# Patient Record
Sex: Female | Born: 1935 | Race: White | Hispanic: No | Marital: Married
Health system: Southern US, Community
[De-identification: ages and names within clinical notes are randomized; demographics above are authoritative.]

---

## 2011-08-04 ENCOUNTER — Emergency Department: Payer: Self-pay

## 2011-08-24 ENCOUNTER — Emergency Department: Payer: Self-pay | Admitting: *Deleted

## 2011-09-27 ENCOUNTER — Emergency Department: Payer: Self-pay | Admitting: Emergency Medicine

## 2011-09-27 ENCOUNTER — Inpatient Hospital Stay: Payer: Self-pay | Admitting: Internal Medicine

## 2011-09-27 LAB — URINALYSIS, COMPLETE
Bacteria: NONE SEEN
Bilirubin,UR: NEGATIVE
Glucose,UR: NEGATIVE mg/dL (ref 0–75)
Hyaline Cast: 1
Ketone: NEGATIVE
Nitrite: NEGATIVE
Ph: 6 (ref 4.5–8.0)
Protein: NEGATIVE
Specific Gravity: 1.003 (ref 1.003–1.030)
Squamous Epithelial: <1
Transitional Epi: <1
WBC UR: 1 /HPF (ref 0–5)

## 2011-09-27 LAB — BASIC METABOLIC PANEL
BUN: 19 mg/dL — ABNORMAL HIGH (ref 7–18)
Chloride: 103 mmol/L (ref 98–107)
EGFR (African American): 56 — ABNORMAL LOW
EGFR (Non-African Amer.): 49 — ABNORMAL LOW
Glucose: 167 mg/dL — ABNORMAL HIGH (ref 65–99)
Osmolality: 282 (ref 275–301)
Potassium: 5.2 mmol/L — ABNORMAL HIGH (ref 3.5–5.1)
Sodium: 138 mmol/L (ref 136–145)

## 2011-09-27 LAB — CBC
HCT: 35.4 % (ref 35.0–47.0)
HGB: 11.8 g/dL — ABNORMAL LOW (ref 12.0–16.0)
MCV: 83 fL (ref 80–100)
RBC: 4.25 10*6/uL (ref 3.80–5.20)
WBC: 9.1 10*3/uL (ref 3.6–11.0)

## 2011-09-27 LAB — COMPREHENSIVE METABOLIC PANEL
Albumin: 3.7 g/dL (ref 3.4–5.0)
Alkaline Phosphatase: 96 U/L (ref 50–136)
Anion Gap: 7 (ref 7–16)
BUN: 15 mg/dL (ref 7–18)
Bilirubin,Total: 0.3 mg/dL (ref 0.2–1.0)
Calcium, Total: 8.9 mg/dL (ref 8.5–10.1)
Co2: 30 mmol/L (ref 21–32)
Creatinine: 0.9 mg/dL (ref 0.60–1.30)
EGFR (Non-African Amer.): >60
Osmolality: 284 (ref 275–301)
Potassium: 3.6 mmol/L (ref 3.5–5.1)
SGOT(AST): 24 U/L (ref 15–37)
SGPT (ALT): 19 U/L
Sodium: 142 mmol/L (ref 136–145)
Total Protein: 7.6 g/dL (ref 6.4–8.2)

## 2011-09-27 LAB — TROPONIN I: Troponin-I: <0.02 ng/mL

## 2011-09-27 LAB — CBC WITH DIFFERENTIAL/PLATELET
Basophil #: 0.1 10*3/uL (ref 0.0–0.1)
Eosinophil #: 0.3 10*3/uL (ref 0.0–0.7)
Lymphocyte #: 2 10*3/uL (ref 1.0–3.6)
MCH: 27.9 pg (ref 26.0–34.0)
MCHC: 33.8 g/dL (ref 32.0–36.0)
MCV: 82 fL (ref 80–100)
Neutrophil %: 69.2 %
Platelet: 208 10*3/uL (ref 150–440)
RDW: 15.8 % — ABNORMAL HIGH (ref 11.5–14.5)

## 2011-09-28 LAB — BASIC METABOLIC PANEL
BUN: 18 mg/dL (ref 7–18)
Chloride: 106 mmol/L (ref 98–107)
Co2: 24 mmol/L (ref 21–32)
Creatinine: 1.05 mg/dL (ref 0.60–1.30)
EGFR (African American): 59 — ABNORMAL LOW
EGFR (Non-African Amer.): 51 — ABNORMAL LOW
Glucose: 72 mg/dL (ref 65–99)
Potassium: 3.7 mmol/L (ref 3.5–5.1)
Sodium: 142 mmol/L (ref 136–145)

## 2011-09-28 LAB — CBC WITH DIFFERENTIAL/PLATELET
Basophil #: 0.1 10*3/uL (ref 0.0–0.1)
Basophil %: 0.6 %
Eosinophil %: 1.2 %
Lymphocyte %: 24.5 %
MCH: 28.1 pg (ref 26.0–34.0)
MCHC: 33.7 g/dL (ref 32.0–36.0)
MCV: 84 fL (ref 80–100)
Monocyte %: 7.9 %
Neutrophil #: 6.8 10*3/uL — ABNORMAL HIGH (ref 1.4–6.5)
Platelet: 183 10*3/uL (ref 150–440)
RBC: 4.24 10*6/uL (ref 3.80–5.20)
RDW: 15.9 % — ABNORMAL HIGH (ref 11.5–14.5)

## 2011-09-28 LAB — LIPID PANEL
HDL Cholesterol: 50 mg/dL (ref 40–60)
VLDL Cholesterol, Calc: 21 mg/dL (ref 5–40)

## 2011-09-28 LAB — PROTIME-INR: INR: 1

## 2011-09-29 ENCOUNTER — Ambulatory Visit: Payer: Self-pay | Admitting: Internal Medicine

## 2011-10-02 LAB — PLATELET COUNT: Platelet: 177 10*3/uL (ref 150–440)

## 2011-10-30 ENCOUNTER — Ambulatory Visit: Payer: Self-pay | Admitting: Internal Medicine

## 2011-10-30 DEATH — deceased

## 2013-05-04 IMAGING — CT CT HEAD WITHOUT CONTRAST
2 series · 15 of 30 positions shown, 19 images · non-contrast
Comparison: none

REASON FOR EXAM: injury to head from fall
COMMENTS:

[Series 2: without · axial · non-contrast · 0.41mm/px · z∈[+414,+538]mm · 13 of 31 slices shown, 17 images]
[im 3/31  brain]
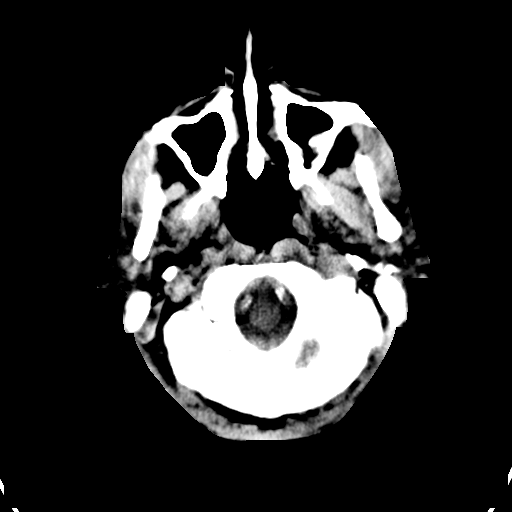
[im 3/31  bone]
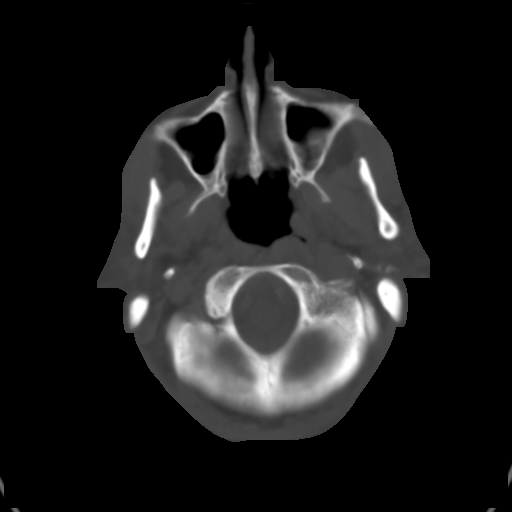
[im 5/31  brain]
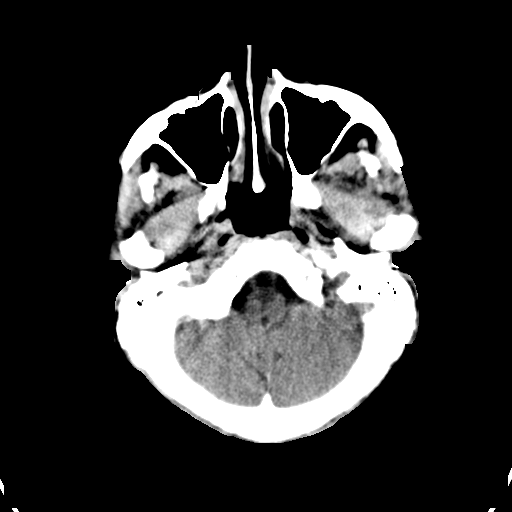
[im 7/31  brain]
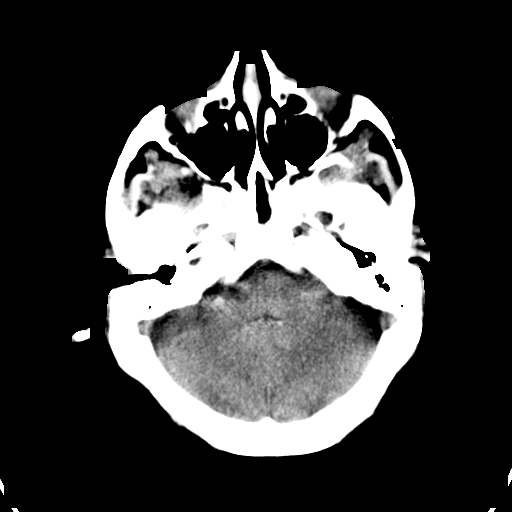
[im 9/31  brain]
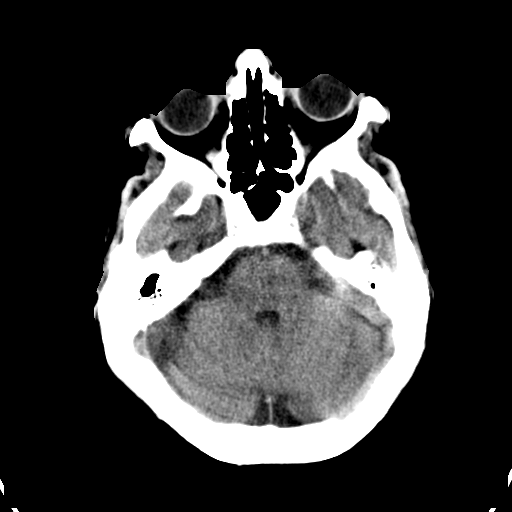
[im 11/31  brain]
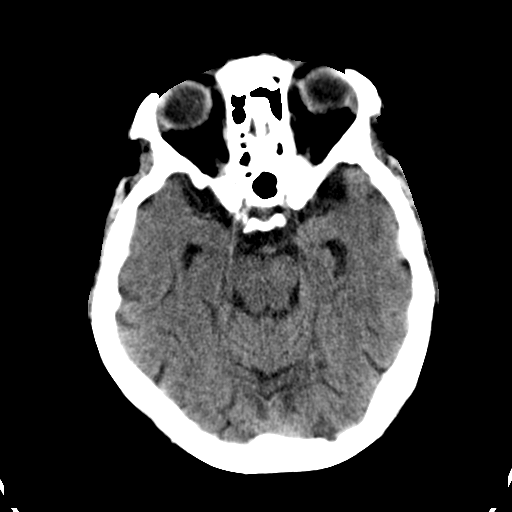
[im 11/31  bone]
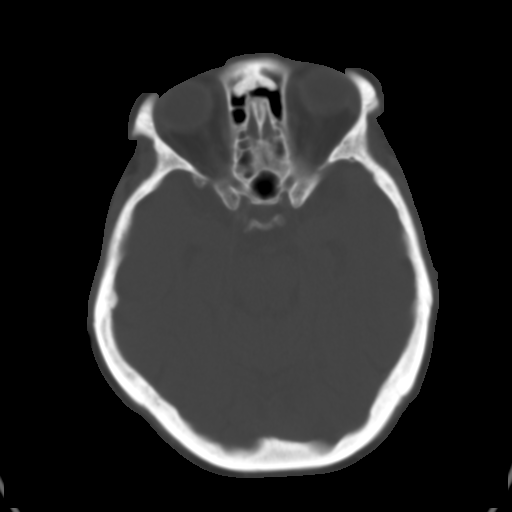
[im 13/31  brain]
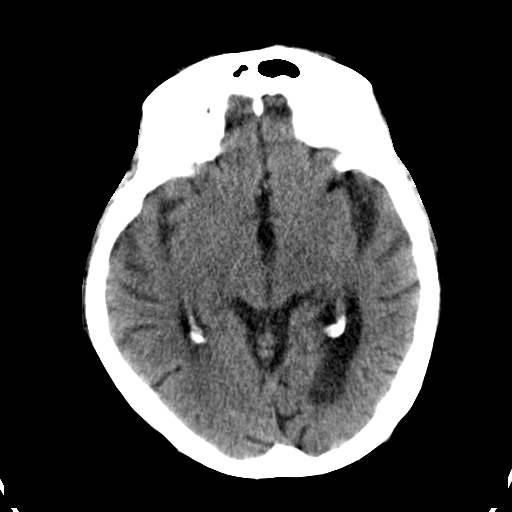
[im 16/31  brain]
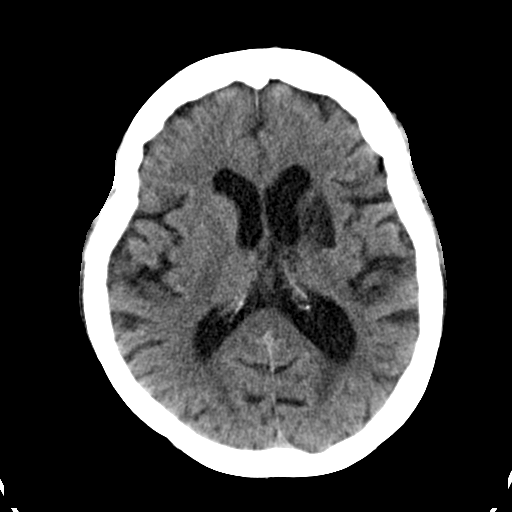
[im 18/31  brain]
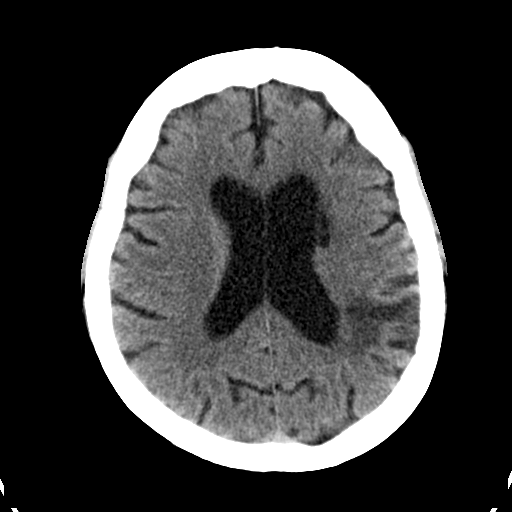
[im 20/31  brain]
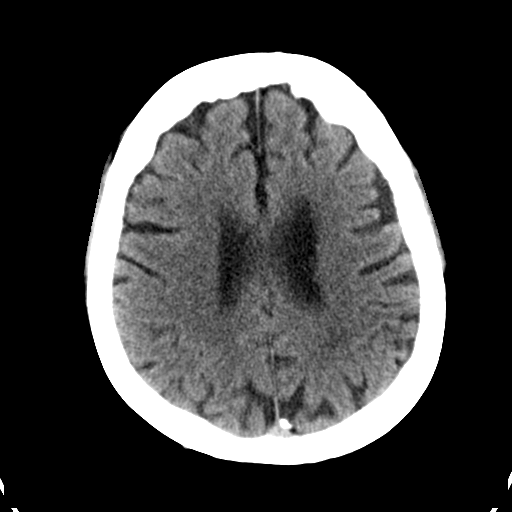
[im 20/31  bone]
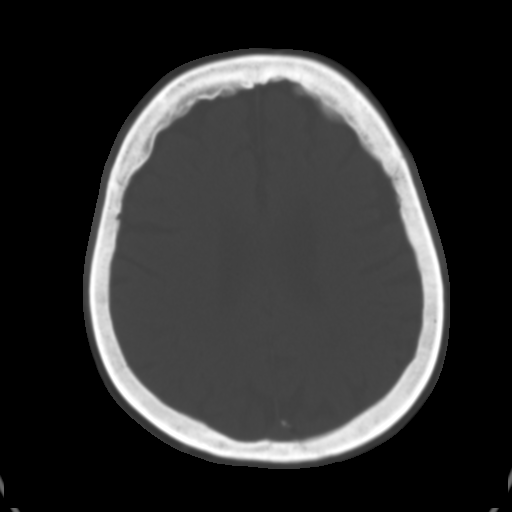
[im 22/31  brain]
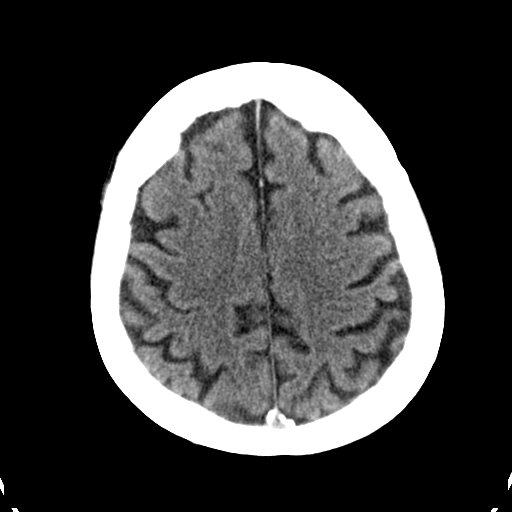
[im 24/31  brain]
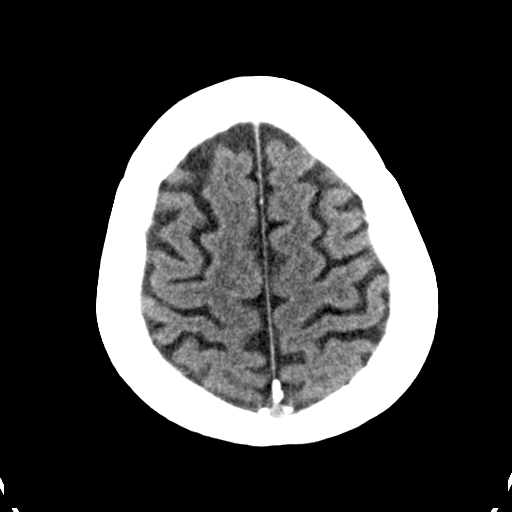
[im 26/31  brain]
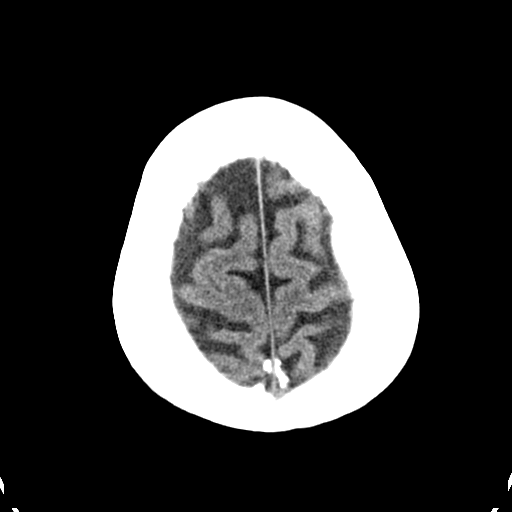
[im 28/31  brain]
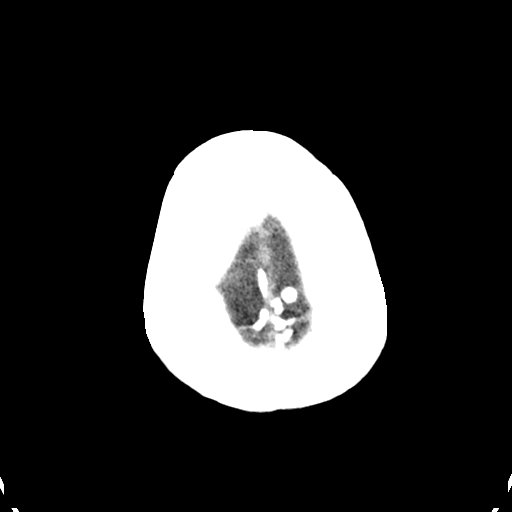
[im 28/31  bone]
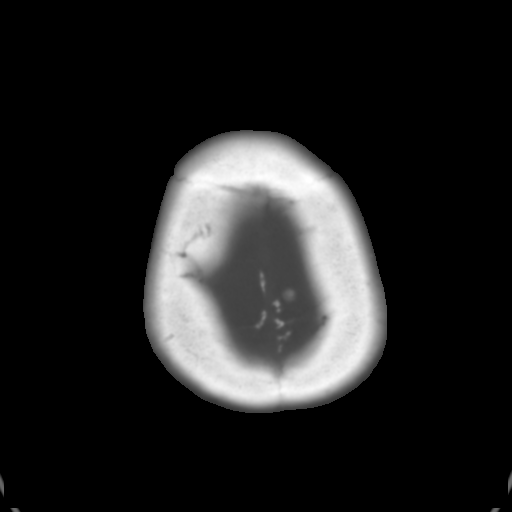

[Series 3: bone · axial · 0.41mm/px · z∈[+414,+434]mm · 2 of 31 slices shown]
[im 3/31  bone]
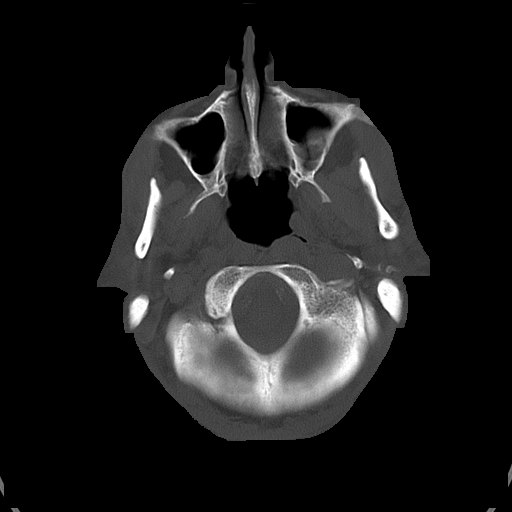
[im 7/31  bone]
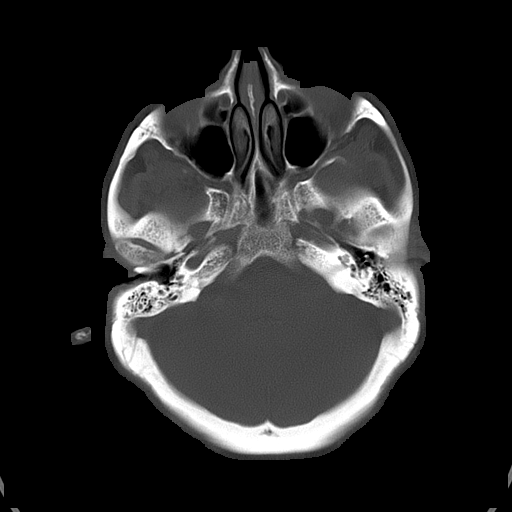

[15 of 30 positions shown; findings below may reference images not displayed]

PROCEDURE:     CT  - CT HEAD WITHOUT CONTRAST  - August 04, 2011  [DATE]

RESULT:     Axial noncontrast CT scanning was performed through the brain
with reconstructions at 5 mm intervals and slice thicknesses. There are no
previous studies for comparison.

There is mild diffuse cerebral and cerebellar atrophy with compensatory
ventriculomegaly. There is encephalomalacia in the left basal ganglia
consistent with previous lacunar infarction. There is decreased density in
the gray and white matter in the posterior parietal lobe on the left which
is nonspecific. There is no evidence of an acute intracranial hemorrhage.
The cerebellum and brainstem are normal in density. At bone window settings
there is no evidence of an acute skull fracture. The observed portions of
the paranasal sinuses and mastoid air cells are clear.
IMPRESSION: 1. There is hypodensity in the left parietal lobe which is of uncertain
etiology and could reflect an area of subacute or old ischemic change. I do
not see significant mass effect and there may be mild ex vacuo dilation of
the posterior horn of the left lateral ventricle.
2. There is an old lacunar infarction in the basal ganglia on the left.
3. I do not see evidence of an acute intracranial hemorrhage.
4. There is no evidence of an acute skull fracture.

Is there a clinical history of previous CVA? Is there the clinical history
of an acute neurologic event with the patient becoming unsteady and falling?

## 2014-06-17 NOTE — H&P (Signed)
PATIENT NAME:  Melinda Burton, Melinda Burton MR#:  [id number] DATE OF BIRTH:  11-01-1931  DATE OF ADMISSION:  09/27/2011  PRIMARY CARE PHYSICIAN: Maurilio Lovely, MD  REASON FOR ADMISSION: Transient ischemic attack versus stroke, status post syncope.  HISTORY OF PRESENT ILLNESS: Melinda Burton is a very nice 81 year old female who is a resident of [address] She has known history of hypertension, dementia, depression and anxiety, and carotid stenosis. The patient has been brought this morning to the ER with history of syncope. All this history has been reported by the ER and the RN at the nursing home. The patient apparently just passed out in the morning and after that she was weak and not able to speak very well. She was evaluated here in the ER and she was discharged after some tests were done. Apparently her CT scan was negative and some of the symptoms including dysarthria and slurred speech and right-sided weakness resolved. The patient was sent back to [facility] and this afternoon she had another episode of syncope for what she was brought back over here at this moment. The patient is mostly aphasic and dysarthric with significant slurred speech.  There is no significant weakness on my physical examination that might be focal, but there is some left side of the face facial droop. The patient has unsteadiness when she walks so apparently we did not ask her to do it this time. When I spoke with the ER physician, the patient has not had any resolution of the symptoms and I do not see any changes of her description of her physical examination from the ER doctor. At this moment, the patient is in her room, she is FULL CODE, and we are going to admit her for TIA, but most likely apparently stroke. Due to the symptoms and also due to the unknown time of the beginning of this situation, she is not a good candidate for thrombolysis or IV heparin.   REVIEW OF SYSTEMS: Very difficult to get due to the fact of  dysarthria. The patient can answer yes and no of some symptoms, but occasionally she just stares to the roof and does not answer. She denies any fever, no significant fatigue. She denies any changes in her vision, blurry vision, or dark spots in her eyesight. She denies any difficulty hearing. No cough, wheezing, or hemoptysis. Denies any chest pain, orthopnea, or persistent nocturnal dyspnea. At the moment she denies any palpitations. She denies any abdominal pain or prior difficulty swallowing. No constipation or diarrhea. Denies any dysuria or hematuria. Denies any polyuria, polydipsia, or polyphagia. She is being diagnosed with impaired fasting glucose but not diabetes. She denies any history of prior bleeding. Denies any history of skin conditions. Denies any significant joint pain at the moment. She does have depression and it seems to be well controlled, but she cannot relay much about this. As far as prior neurologic history, she denies any prior strokes or transient ischemic attacks. No headaches. No ataxia. Apparently at her baseline she is demented, but she is very active. She ambulates without assistance and is able to carry good conversations with the staff.   PAST MEDICAL HISTORY:  1. Hypertension.  2. Carotid artery stenosis.  3. Dementia, apparently vascular.  4. Hypertension.  5. Depression.  6. Anxiety.  7. Glucose intolerance. 8. History of skin cancer. 9. Renal artery stenosis, now resolved.  MEDICATIONS:  1. Zoloft 50 mg once daily.  2. Namenda 10 mg once daily.  3. Metoprolol 50  mg twice daily.  4. Amlodipine 10 mg every day.  5. Spironolactone 25 mg half tablet every day.  6. Lorazepam 0.5 mg three times daily.   DRUG ALLERGIES: Lisinopril.   PAST SURGICAL HISTORY:  1. Appendectomy. 2. Renal artery stents bilaterally.   FAMILY HISTORY: Noncontributory at this moment. Apparently only diabetes in certain members of family.  SOCIAL HISTORY: The patient used to smoke.  In her history it says she quit 30 years ago and she used to smoke one pack a day for many years. She does not drink. She lives at [facility]. She has a daughter and a son.     PHYSICAL EXAMINATION:  VITALS: Blood pressure 118/74, pulse 62, respirations 17, and temperature 96.8. Saturation is 94% on room air.   GENERAL: She is alert, lying down on the bed. She is cooperative for the most. She is able to communicate and occasionally uses own phrases and she is able to answer yes and no.   NEURO: Extraocular movements are intact. Pupils are equal, round, and reactive. Her speech is slurred. Cranial nerves II through XII seem to be okay except for the patient being dysarthric and unable to move her tongue from right to left. Her speech is slurred. There is deviation of the left corner of the mouth/face drooping. The patient is able to open and close her eyes. Good strength in facial muscles than the ones around the mouth. Vision seems to be preserved.   Difficult to evaluate, but the patient does not seem to have any visual defects on her fields at this moment. Smell seems to be appropriate.   She is able to shrug her shoulders. Bilateral strength seems to be equal, mostly 4 out of 5 bilaterally. There is no drop of the arms when pronated. There is no Babinski. Deep tendon reflexes are +2 bilaterally in all four extremities.   Cerebellar test - the patient has a little bit of dysmetria on the left side, but the right side seems to be okay and she is able to sit up without lying down on any side. I did not ask her to stand and walk today.   HEENT: Pupils are equal and reactive. Extraocular movements are intact. Mucosa is dry. No oral lesions. No pharyngeal exudates.   NECK: Supple. No JVD. No thyromegaly. No adenopathy. Trachea is center. Positive carotid bruit and positive radiation of systolic ejection murmur to carotid arteries. The carotid bruits are bilateral.   HEART: Regular rate and  rhythm with a systolic ejection murmur that is 4/6 with radiation to the neck and to the axillary area.  LUNGS: Clear without any wheezing or crepitus. Good air entrance.  ABDOMEN: Soft, nontender, and nondistended. No hepatosplenomegaly. No masses. Bowel sounds are positive.  EXTREMITIES: No edema. No cyanosis. No clubbing. Pulses +2.   SKIN: No significant rashes or petechiae.  MUSCULOSKELETAL: Negative for joint deformities or effusions.  PSYCH: Mood-flat affect.   LABORATORY, DIAGNOSTIC AND RADIOLOGIC DATA: Labs are ordered. As mentioned before, her CT scan today without contrast showed chronic ischemic changes, but no acute abnormalities. That was done this morning.   She had an EKG which was pretty much normal sinus rhythm or sinus bradycardia mostly. The patient is on metoprolol. No ST or T wave elevation or depression.   Glucose is 167, BUN 19, sodium 138, and potassium 5.2, likely hemolyzed. All electrolytes are within normal limits. Her hemoglobin is 11.8, prior to that was about 12. White count is 9.1 and  platelets in the 160s. Troponins are negative.   ASSESSMENT AND PLAN:  1. Transient ischemic attack, but most likely stroke. The patient has significant changes after syncopal episode this morning that apparently resolved. The patient had a CT scan that was negative and she was brought back to her nursing home. After that she developed the same symptoms of stroke and dysarthria, aphasia, and very slurred speech for what she was brought back to the ER. The patient is not on aspirin for what we are going to start aspirin. Her previous lipid profile was normal. She is not on a statin. We are going to repeat a lipid profile and start a statin. I am going to start her on a low dose aspirin, at this moment 81 mg, and I am going to put her on deep vein thrombosis prophylaxis with heparin due to her symptoms and the possibility of this being a transient ischemic attack. She is not a good  candidate for IV heparin or any type of thrombolysis. We are going to do PT, OT, and swallowing evaluation in the morning and order support including neuro checks.  I am going to order a MRI and a neurologic consult as well.  2. Carotid artery stenosis. No reason to repeat studies at this moment. This is an old issue.  3. Hypertension. The patient is taking metoprolol and spironolactone and amlodipine. On her current medications, she is no longer taking spironolactone. She has a history of renal artery stenosis for which she obtained bilateral renal stents. She is not on ACE inhibitors due to that, although now with the stents this should not be an issue if we need to put her on ACE inhibitors, which I doubt. Her blood pressure goals are going to be let her blood pressure be high to increase perfusion. I am going to just treat blood pressures above systolic of 220 or diastolics above 110. After 48 hours we can start treating some of her blood pressure if it remains elevated. 4. Bradycardia. The patient is on a beta blocker. We are going to stop the beta blocker and continue to watch. I am going to hold all her blood pressure medications.  5. Dementia. Seems to be stable at this moment. Her Mini-Mental is difficult to assess. We are going to just reassess that in the next couple of days after we see whether or not there is resolution of her symptoms. 6. Hyperglycemia. The patient has history of impaired fasting glucose. We are going to order Accu-Cheks and treat any elevated blood sugars with insulin sliding scale. Her other medical problems seem to be stable. 7. DVT prophylaxis with heparin. The patient is going to be NPO and she is a FULL CODE.   ____________________________ Felipa Furnace, MD rsg:slb D: 09/27/2011 23:53:00 ET T: 09/28/2011 07:25:02 ET JOB#: [id number]  cc: Maurilio Lovely, MD Felipa Furnace, MD, <Dictator> Sabryn Preslar Juanda Chance MD ELECTRONICALLY SIGNED  10/15/2011 13:14

## 2014-06-17 NOTE — Discharge Summary (Signed)
PATIENT NAME:  BOWEN, Melinda Burton MR#:  [id number] DATE OF BIRTH:  [birth year]  DATE OF ADMISSION:  09/27/2011 DATE OF DISCHARGE:  10/03/2011  ADMITTING PHYSICIAN: Dr. Mordecai Maes  DISCHARGING PHYSICIAN: Dr. Enid Baas  PRIMARY CARE PHYSICIAN: Physician at [facility] assisted living  CONSULTATIONS IN THE HOSPITAL:  1. Palliative care consultation.  2. Neurology consultation by Dr. Mellody Drown.   DISCHARGE DIAGNOSES:  1. Acute right temporoparietal and right caudate nucleus cerebrovascular accident with fascial droop, dysphagia.  2. Dysphagia.   3. History of severe Alzheimer's dementia.  4. Hyperlipidemia.  5. Hypertension.   DISCHARGE MEDICATIONS:  1. Roxanol 20 mg/mL, 0.25 to 0.5 mL p.o. sublingual every 1 to 2 hours p.r.n.  2. Lorazepam 0.5 to 1 mg p.o. sublingual every 2 to 4 hours p.r.n. for agitation, anxiety.  3. Ranitidine 150 mg p.o. b.i.d.  4. ABHR suppository one per rectum every 4 to 6 hours p.r.n.  5. Ativan 0.5 mg p.o. q.i.d. scheduled at 8:00 a.m., 12:00 p.m., 4:00 p.m. and 8:00 p.m.   6. Risperidone oral disintegrating tablet 0.5 mg every day at 16:00 hours.  7. Haldol 0.5 to 1 mg p.o. t.i.d. p.r.n. for agitation.  LABORATORY, DIAGNOSTIC AND RADIOLOGICAL DATA:  Ultrasound carotid Doppler showing partial fluid reversal right carotid artery, carotid vertebral CTA may prove useful for further evaluation. Stenosis probably less than 50% at carotid artery bifurcation. No hemodynamically significant carotid artery stenosis. MRI brain showing acute right temporoparietal and right caudate nucleus, caudate head infarcts. Echo Doppler showing LV systolic function normal, ejection fraction of 55%, moderate concentric left ventricular hypertrophy is present. Mild to moderate valvular aortic stenosis also present.   WBC 10.4, hemoglobin 11.9, hematocrit 35.3, platelet count 182.   Sodium 142, potassium 3.7, chloride 106, bicarbonate 24, BUN 18, creatinine 1.05, glucose 72,  calcium 9.0. INR 1.0. LDL 151, HDL 50, triglycerides 107, serum cholesterol 222. Troponins negative on admission and negative for any urinary tract infection on urinalysis. CT head showing chronic ischemic changes without any acute abnormality.   BRIEF HOSPITAL COURSE: Melinda Burton is a 81 year old elderly female with history of severe dementia from assisted living facility at the memory unit at [facility] was brought in secondary to on and off left-sided facial droop. She was seen in the ED. CT of the head was negative. Was sent back because she was feeling fine but immediately brought in with further slurred speech, dysarthria and left facial droop which more prominent.   Acute cerebrovascular accident. MRI showed acute right-sided temporoparietal infarct and also caudate head nucleus infarct. Since admission patient has been mostly bedbound. She does have strength in all four extremities, slightly weaker on the left side. She has a left facial droop present. But she has been having dysarthria and severe dysphagia and has been having poor p.o. intake. Seen by neurology and also speech pathologist. Patient has been sleeping most of the time and with dementia she has periods of agitation for which she is requiring occasional Haldol and also Ativan. Patient has LIVING WILL clearly stating that she does not want any PEG tube for life-sustaining purposes. Palliative care consult was requested per family's request. The patient has a son and two daughters who all agree that patient would be a candidate for hospice. Patient was being transferred to hospice home. Course has been otherwise uneventful in the hospital. She was unable to even swallow her pills while in the hospital. She was also on Exelon patch for her dementia   CODE STATUS  is DO NOT RESUSCITATE after discussion with palliative care team.   DISCHARGE DISPOSITION: To hospice home.   DISCHARGE CONDITION: Guarded with poor prognosis.   TIME SPENT  ON DISCHARGE: 40 minutes. ____________________________ Enid Baasadhika Adar Rase, MD rk:cms D: 10/03/2011 16:35:01 ET T: 10/05/2011 09:57:03 ET  JOB#: [id number] cc: [facility] Enid BaasADHIKA Andri Prestia MD ELECTRONICALLY SIGNED 10/12/2011 14:18

## 2014-06-17 NOTE — Consult Note (Signed)
Referring Physician:  James Ivanoff, Roselie Awkward :   Primary Care Physician:  James Ivanoff, [facility] : [facility], [address], [place], [place] [address], [place] [phone]  Reason for Consult:  Admit Date: 28-Sep-2011   Chief Complaint: some L sided weakness   Reason for Consult: CVA   History of Present Illness:  History of Present Illness:   81 yo RHD F who presents from nursing home secondary to slurred speech and leaning to L.  Symptoms have seemed to fluctuate per family.  The nursing staff at [facility] first noticed something 4 days ago and pt came to ER where she got evaluated and sent home because symptoms were gone.  Later that evening, symptoms returned and then pt had MRI the next day which showed new stroke.  No seizure activity or weakness was noted per nursing.  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  ROS:   Review of Systems   limited ROS secondary to pts inability to speak and pt falling asleep during exam  Past Medical/Surgical Hx:  Diabetes:   htn:   Dementia:   Depression:   Anxiety:   Other, see comments: un known  Past Medical/ Surgical Hx:   Past Medical History carotid stens    Past Surgical History none   Home Medications: Medication Instructions Last Modified Date/Time  Vitamin D3 50,000 intl units oral capsule 1 cap(s) orally once a week (8 am) 30-Jul-13 20:06  Exelon 4.6 mg/24 hr transdermal film, extended release 1 patch transdermal once a day (8 pm) **remove old patch** 30-Jul-13 20:06  amlodipine 10 mg oral tablet 1 tab(s) orally once a day (8 am) 30-Jul-13 20:06  Deep Sea Nasal 0.65% nasal spray 2 spray(s) nasal once a day (at bedtime) (8 pm) 30-Jul-13 20:06  Hydrocerin topical lotion Apply topically to both feet 2 times a day (8 am, 8 pm) 30-Jul-13 20:06  metoprolol tartrate 50 mg oral tablet 1 tab(s) orally 2 times a day (8 am, 8 pm) **hold for systolic blood pressure less than or  equal to 110 and HR less than or equal to 59) 30-Jul-13 20:06  sertraline 50 mg oral tablet 3 tab(s) (150 mg) orally once a day (in the morning) with food (8 am) 30-Jul-13 20:06  trazodone 50 mg oral tablet 1 tab(s) orally once a day (at bedtime) (8 pm) 30-Jul-13 20:06  furosemide 20 mg oral tablet 1 tab(s) orally once a day (in the morning) (8 am) 30-Jul-13 20:06  lorazepam 0.5 mg oral tablet 1 tab(s) orally 4 times a day (8 am, 12 pm, 4 pm, 8 pm) 30-Jul-13 20:06  Deep Sea Nasal 0.65% nasal spray 2 spray(s) nasal 3 times a day, As Needed for nasal congestion/dryness 30-Jul-13 20:06   Allergies:  No Known Allergies:   Social/Family History:  Employment Status: retired   Lives With: alone   Living Arrangements: extended care facility   Social History: no tob, no EtOH, no illicits   Family History: none   Vital Signs: **Vital Signs.:   01-Aug-13 11:30   Vital Signs Type Routine   Temperature Temperature (F) 98.6   Celsius 37   Temperature Source Oral   Pulse Pulse 79   Respirations Respirations 18   Systolic BP Systolic BP 572   Diastolic BP (mmHg) Diastolic BP (mmHg) 69   Mean BP 93   BP Source  if not from Vital Sign Device non-invasive   Pulse Ox % Pulse Ox % 96  Pulse Ox Activity Level  At rest   Oxygen Delivery Room Air/ 21 %   Physical Exam:  General: looks older than stated age, NAD, poorly kept   HEENT: normocephalic, sclera nonicteric, oropharynx clear   Neck: supple, no JVD, no bruits   Chest: CTA B, no wheezes, good movement   Cardiac: RRR, no murmurs, no edema, 2+ pulses   Extremities: no C/C/E, FROM   Neurologic Exam:  Mental Status: lethargic but knows name only, marginal repetition;  can follow simple commands, moderate dysarthria, difficulty naming   Cranial Nerves: PERRLA, EOMI, nl VF, L facial droop, tongue midline, shoulder shrug equal,   Motor Exam: 5/5 B normal, tone, no tremor   Deep Tendon Reflexes: 1+/4 B, Plantars down going bilataterl   Sensory  Exam: inconsitistant   Lab Results: Routine Chem:  30-Jul-13 19:55    Hemoglobin A1c ([facility]) 5.6 (The American Diabetes Association recommends that a primary goal of therapy should be <7% and that physicians should reevaluate the treatment regimen in patients with HbA1c values consistently >8%.)   Result Comment metb - Slight hemolysis, interpret results with  - caution.  Result(s) reported on 27 Sep 2011 at 08:24PM.  31-Jul-13 04:37    Glucose, Serum 72   BUN 18   Creatinine (comp) 1.05   Sodium, Serum 142   Potassium, Serum 3.7   Chloride, Serum 106   CO2, Serum 24   Calcium (Total), Serum 9.0   Anion Gap 12   Osmolality (calc) 284   eGFR (African American)  59   eGFR (Non-African American)  51 (eGFR values <62m/min/1.73 m2 may be an indication of chronic kidney disease (CKD). Calculated eGFR is useful in patients with stable renal function. The eGFR calculation will not be reliable in acutely ill patients when serum creatinine is changing rapidly. It is not useful in  patients on dialysis. The eGFR calculation may not be applicable to patients at the low and high extremes of body sizes, pregnant women, and vegetarians.)   Cholesterol, Serum  222   Triglycerides, Serum 107   HDL (INHOUSE) 50   VLDL Cholesterol Calculated 21   LDL Cholesterol Calculated  151 (Result(s) reported on 28 Sep 2011 at 06:55AM.)  Routine Coag:  31-Jul-13 04:37    Prothrombin 13.2   INR 1.0 (INR reference interval applies to patients on anticoagulant therapy. A single INR therapeutic range for coumarins is not optimal for all indications; however, the suggested range for most indications is 2.0 - 3.0. Exceptions to the INR Reference Range may include: Prosthetic heart valves, acute myocardial infarction, prevention of myocardial infarction, and combinations of aspirin and anticoagulant. The need for a higher or lower target INR must be assessed individually. Reference: The Pharmacology and  Management of the Vitamin K  antagonists: the seventh ACCP Conference on Antithrombotic and Thrombolytic Therapy. [id number].0165Sept:126 (3suppl): [id number] A HCT value >55% may artifactually increase the PT.  In one study,  the increase was an average of 25%. Reference:  "Effect on Routine and Special Coagulation Testing Values of Citrate Anticoagulant Adjustment in Patients with High HCT Values." American Journal of Clinical Pathology 2006;126:400-405.)  Routine Hem:  31-Jul-13 04:37    WBC (CBC) 10.4   RBC (CBC) 4.24   Hemoglobin (CBC)  11.9   Hematocrit (CBC) 35.3   Platelet Count (CBC) 183   MCV 84   MCH 28.1   MCHC 33.7   RDW  15.9   Neutrophil % 65.8   Lymphocyte % 24.5  Monocyte % 7.9   Eosinophil % 1.2   Basophil % 0.6   Neutrophil #  6.8   Lymphocyte # 2.5   Monocyte # 0.8   Eosinophil # 0.1   Basophil # 0.1 (Result(s) reported on 28 Sep 2011 at 06:46AM.)   Radiology Results: MRI:    31-Jul-13 16:06, MRI Brain Without Contrast   MRI Brain Without Contrast    REASON FOR EXAM:    CVA  COMMENTS:       PROCEDURE: MR  - MR BRAIN WO CONTRAST  - Sep 28 2011  4:06PM     RESULT: History: CVA.    Comparison Study: Prior head CT 09/27/2011.    Findings: Multiplanar, multisequence imaging the brain is obtained.   Diffusion-weighted images reveal acute infarct in the right temporal   parietal lobes and caudate head. Chronic white matter ischemic change and   old lacunar infarcts noted. Vascular flow voids normal.    IMPRESSION:  Acute right temporoparietal and right caudate head infarcts.    Verified By: Osa Craver, M.D., MD   Radiology Impression:  Radiology Impression: MRI personally reviewed by me and shows an acute R MCA infarct that looks like it is embolic;  there is an old L MCA infarct as wel mild atrophty   Impression/Recommendations:  Recommendations:   labs reviewed and shows mild leukocytosis d/w referring physician   R MCA acute infarct-   stable now but fluctuations were prior which was probably secondary to large vessel occlusion.  Etiology most likely thromboembolic with known carotid diseas.  Hypercoagable state as well as cardioembolic could cause this as well. Dementia-  per family, this has been around for the past 10 and this is what got her placed in a NH.  Family told that it was Alheizmers type. Leukocytosis-  unclear etiology Hyperlipidemia-  elevated Diabetes-  controlled carotids pending would repeat swallow on Monday because this could be a limitation to discharge; discussion with family state no continue ASA 11m daily statin should be adjust to LDL < 70 continue good BP control but slowly continue [place] for dementia PT/OT/ST evaluations will sign off, please call with questions [facility]  Electronic Signatures: SJamison Neighbor(MD)  (Signed 01-Aug-13 14:46)  Authored: REFERRING PHYSICIAN, Primary Care Physician, Consult, History of Present Illness, Review of Systems, PAST MEDICAL/SURGICAL HISTORY, HOME MEDICATIONS, ALLERGIES, Social/Family History, NURSING VITAL SIGNS, Physical Exam-, LAB RESULTS, RADIOLOGY RESULTS, Recommendations   Last Updated: 01-Aug-13 14:46 by SJamison Neighbor(MD)
